# Patient Record
Sex: Female | Born: 1970 | Hispanic: Yes | Marital: Single | Smoking: Never smoker
Health system: Southern US, Community
[De-identification: ages and names within clinical notes are randomized; demographics above are authoritative.]

## PROBLEM LIST (undated history)

## (undated) DIAGNOSIS — E785 Hyperlipidemia, unspecified: Secondary | ICD-10-CM

## (undated) HISTORY — PX: BREAST BIOPSY: SHX20

---

## 2017-06-23 ENCOUNTER — Other Ambulatory Visit: Payer: Self-pay | Admitting: Specialist

## 2017-06-23 DIAGNOSIS — Z1231 Encounter for screening mammogram for malignant neoplasm of breast: Secondary | ICD-10-CM

## 2017-07-09 ENCOUNTER — Ambulatory Visit
Admission: RE | Admit: 2017-07-09 | Discharge: 2017-07-09 | Disposition: A | Discharge status: Home / Self Care | Payer: No Typology Code available for payment source | Source: Ambulatory Visit | Attending: Specialist | Admitting: Specialist

## 2017-07-09 DIAGNOSIS — Z1231 Encounter for screening mammogram for malignant neoplasm of breast: Secondary | ICD-10-CM

## 2017-07-13 ENCOUNTER — Other Ambulatory Visit: Payer: Self-pay | Admitting: Specialist

## 2017-07-13 DIAGNOSIS — R928 Other abnormal and inconclusive findings on diagnostic imaging of breast: Secondary | ICD-10-CM

## 2017-08-02 ENCOUNTER — Other Ambulatory Visit (HOSPITAL_COMMUNITY): Payer: Self-pay | Admitting: *Deleted

## 2017-08-02 DIAGNOSIS — R928 Other abnormal and inconclusive findings on diagnostic imaging of breast: Secondary | ICD-10-CM

## 2017-08-03 ENCOUNTER — Other Ambulatory Visit (HOSPITAL_COMMUNITY): Payer: Self-pay | Admitting: Obstetrics and Gynecology

## 2017-08-03 ENCOUNTER — Ambulatory Visit
Admission: RE | Admit: 2017-08-03 | Discharge: 2017-08-03 | Disposition: A | Discharge status: Home / Self Care | Payer: No Typology Code available for payment source | Source: Ambulatory Visit | Attending: Obstetrics and Gynecology | Admitting: Obstetrics and Gynecology

## 2017-08-03 ENCOUNTER — Ambulatory Visit (HOSPITAL_COMMUNITY)
Admission: RE | Admit: 2017-08-03 | Discharge: 2017-08-03 | Disposition: A | Discharge status: Home / Self Care | Payer: Self-pay | Source: Ambulatory Visit | Attending: Obstetrics and Gynecology | Admitting: Obstetrics and Gynecology

## 2017-08-03 ENCOUNTER — Encounter (HOSPITAL_COMMUNITY): Payer: Self-pay

## 2017-08-03 ENCOUNTER — Other Ambulatory Visit (HOSPITAL_COMMUNITY): Payer: Self-pay | Admitting: *Deleted

## 2017-08-03 VITALS — BP 96/64 | Temp 98.1°F | Ht 63.0 in | Wt 165.4 lb

## 2017-08-03 DIAGNOSIS — R928 Other abnormal and inconclusive findings on diagnostic imaging of breast: Secondary | ICD-10-CM

## 2017-08-03 DIAGNOSIS — N6310 Unspecified lump in the right breast, unspecified quadrant: Secondary | ICD-10-CM

## 2017-08-03 DIAGNOSIS — Z1239 Encounter for other screening for malignant neoplasm of breast: Secondary | ICD-10-CM

## 2017-08-03 NOTE — Progress Notes (Signed)
Patient referred to [facility] by the [facility] due to recommending additional imaging of the right breast. Screening mammogram completed 07/09/2017.  Pap Smear: Pap smear not completed today. Last Pap smear was in 2017 at [facility] in [facility] and normal per patient. Per patient has no history of an abnormal Pap smear. No Pap smear results are in EPIC.  Physical exam: Breasts Breasts symmetrical. No skin abnormalities bilateral breasts. No nipple retraction bilateral breasts. No nipple discharge bilateral breasts. No lymphadenopathy. No lumps palpated bilateral breasts. No complaints of pain or tenderness on exam. Referred patient to the [facility] of [facility] for a right breast diagnostic mammogram and possible breast ultrasound per recommendation. Appointment scheduled for Tuesday, August 03, 2017 at 1450.        Pelvic/Bimanual No Pap smear completed today since last Pap smear was in [address] per patient. Pap smear not indicated per BCCCP guidelines.   Smoking History: Patient has never smoked.  Patient Navigation: Patient education provided. Access to services provided for patient through [facility] program. Spanish interpreter provided.  Used Spanish interpreter [employer] from [place].

## 2017-08-03 NOTE — Patient Instructions (Signed)
Explained breast self awareness with Yasmin Henrie. Patient did not need a Pap smear today due to last Pap smear was in 2017 per patient. Let her know BCCCP will cover Pap smears every 3 years unless has a history of abnormal Pap smears. Referred patient to the [facility] of [facility] for a right breast diagnostic mammogram and possible breast ultrasound per recommendation. Appointment scheduled for Tuesday, August 03, 2017 at 1450. Symphani Kalt verbalized understanding.  Jourden Delmont, Kathaleen Maser, RN 3:27 PM

## 2017-08-04 ENCOUNTER — Encounter (HOSPITAL_COMMUNITY): Payer: Self-pay | Admitting: *Deleted

## 2017-08-05 ENCOUNTER — Other Ambulatory Visit (HOSPITAL_COMMUNITY): Payer: Self-pay | Admitting: Obstetrics and Gynecology

## 2017-08-05 ENCOUNTER — Ambulatory Visit
Admission: RE | Admit: 2017-08-05 | Discharge: 2017-08-05 | Disposition: A | Discharge status: Home / Self Care | Payer: No Typology Code available for payment source | Source: Ambulatory Visit | Attending: Obstetrics and Gynecology | Admitting: Obstetrics and Gynecology

## 2017-08-05 DIAGNOSIS — N6310 Unspecified lump in the right breast, unspecified quadrant: Secondary | ICD-10-CM

## 2017-08-05 HISTORY — PX: BREAST BIOPSY: SHX20

## 2019-04-17 ENCOUNTER — Other Ambulatory Visit: Payer: Self-pay | Admitting: *Deleted

## 2019-04-17 DIAGNOSIS — Z1231 Encounter for screening mammogram for malignant neoplasm of breast: Secondary | ICD-10-CM

## 2019-06-20 ENCOUNTER — Ambulatory Visit (HOSPITAL_COMMUNITY)
Admission: RE | Admit: 2019-06-20 | Discharge: 2019-06-20 | Disposition: A | Discharge status: Home / Self Care | Payer: Self-pay | Source: Ambulatory Visit | Attending: Obstetrics and Gynecology | Admitting: Obstetrics and Gynecology

## 2019-06-20 ENCOUNTER — Ambulatory Visit
Admission: RE | Admit: 2019-06-20 | Discharge: 2019-06-20 | Disposition: A | Discharge status: Home / Self Care | Payer: No Typology Code available for payment source | Source: Ambulatory Visit | Attending: Obstetrics and Gynecology | Admitting: Obstetrics and Gynecology

## 2019-06-20 ENCOUNTER — Encounter (HOSPITAL_COMMUNITY): Payer: Self-pay

## 2019-06-20 ENCOUNTER — Other Ambulatory Visit: Payer: Self-pay

## 2019-06-20 DIAGNOSIS — N898 Other specified noninflammatory disorders of vagina: Secondary | ICD-10-CM

## 2019-06-20 DIAGNOSIS — Z01419 Encounter for gynecological examination (general) (routine) without abnormal findings: Secondary | ICD-10-CM

## 2019-06-20 DIAGNOSIS — Z1231 Encounter for screening mammogram for malignant neoplasm of breast: Secondary | ICD-10-CM

## 2019-06-20 HISTORY — DX: Hyperlipidemia, unspecified: E78.5

## 2019-06-20 NOTE — Progress Notes (Signed)
No complaints today.   Pap Smear: Pap smear completed today. Last Pap smear was in 2017 at [facility] and normal per patient. Per patient has no history of an abnormal Pap smear. No Pap smear results are in Epic.  Physical exam: Breasts Breasts symmetrical. No skin abnormalities bilateral breasts. No nipple retraction bilateral breasts. No nipple discharge bilateral breasts. No lymphadenopathy. No lumps palpated bilateral breasts. No complaints of pain or tenderness on exam. Referred patient to the [place] for a screening mammogram. Appointment scheduled for Tuesday, June 20, 2019 at 1510.        Pelvic/Bimanual   Ext Genitalia No lesions, no swelling and no discharge observed on external genitalia.         Vagina Vagina pink and normal texture. No lesions and thick white discharge observed in vagina. Wet prep completed.       Cervix Cervix is present. Cervix pink and of normal texture. Thick white discharge observed on cervix.    Uterus Uterus is present and palpable. Uterus in normal position and normal size.        Adnexae Bilateral ovaries present and palpable. No tenderness on palpation.         Rectovaginal No rectal exam completed today since patient had no rectal complaints. No skin abnormalities observed on exam.    Smoking History: Patient has never smoked.  Patient Navigation: Patient education provided. Access to services provided for patient through [facility] program. Spanish interpreter provided.   Breast and Cervical Cancer Risk Assessment: Patient has no family history of breast cancer, known genetic mutations, or radiation treatment to the chest before age 6. Patient has no history of cervical dysplasia, immunocompromised, or DES exposure in-utero.  Risk Assessment    Risk Scores      06/20/2019   Last edited by: Armond Hang, LPN   5-year risk: 0.7 %   Lifetime risk: 6.5 %         Used Spanish interpreter Rudene Anda  from [place].

## 2019-06-20 NOTE — Patient Instructions (Signed)
Explained breast self awareness with Kristine Cohen. Let patient know BCCCP will cover Pap smears and HPV typing every 5 years unless has a history of abnormal Pap smears. Referred patient to the [facility] for a screening mammogram. Appointment scheduled for Tuesday, June 20, 2019 at 1510. Patient aware of appointment and will be there. Let patient know will follow up with her within the next couple weeks with results with results of Pap smear and wet prep by phone. Informed patient that the Breast Center will follow-up with her within the next couple of weeks with results of mammogram by letter or phone. Kristine Cohen verbalized understanding.  Kristine Cohen, Arvil Chaco, RN 2:17 PM

## 2019-06-21 LAB — CERVICOVAGINAL ANCILLARY ONLY
Bacterial vaginitis: NEGATIVE
Candida vaginitis: NEGATIVE
Trichomonas: NEGATIVE

## 2019-06-22 LAB — CYTOLOGY - PAP
Diagnosis: NEGATIVE
HPV: NOT DETECTED

## 2019-06-30 ENCOUNTER — Telehealth (HOSPITAL_COMMUNITY): Payer: Self-pay | Admitting: *Deleted

## 2019-06-30 NOTE — Telephone Encounter (Signed)
Called patient with Spanish interpreter Benjamine Sprague. Let her know that her Pap smear was normal and HPV negative. Informed patient that her next Pap smear is due in 5 years. Patient verbalized understanding.

## 2019-07-03 ENCOUNTER — Encounter: Payer: Self-pay | Admitting: *Deleted

## 2022-01-19 ENCOUNTER — Other Ambulatory Visit: Payer: Self-pay

## 2022-01-19 DIAGNOSIS — Z1231 Encounter for screening mammogram for malignant neoplasm of breast: Secondary | ICD-10-CM

## 2022-02-03 ENCOUNTER — Ambulatory Visit
Admission: RE | Admit: 2022-02-03 | Discharge: 2022-02-03 | Disposition: A | Discharge status: Home / Self Care | Payer: Self-pay | Source: Ambulatory Visit | Attending: Obstetrics and Gynecology | Admitting: Obstetrics and Gynecology

## 2022-02-03 ENCOUNTER — Ambulatory Visit: Payer: No Typology Code available for payment source | Admitting: *Deleted

## 2022-02-03 ENCOUNTER — Other Ambulatory Visit: Payer: Self-pay

## 2022-02-03 VITALS — BP 120/80 | Wt 176.7 lb

## 2022-02-03 DIAGNOSIS — Z1231 Encounter for screening mammogram for malignant neoplasm of breast: Secondary | ICD-10-CM

## 2022-02-03 DIAGNOSIS — Z1239 Encounter for other screening for malignant neoplasm of breast: Secondary | ICD-10-CM

## 2022-02-03 DIAGNOSIS — Z1211 Encounter for screening for malignant neoplasm of colon: Secondary | ICD-10-CM

## 2022-02-03 NOTE — Patient Instructions (Signed)
Explained breast self awareness with Kristine Cohen. Patient did not need a Pap smear today due to last Pap smear and HPV typing was 06/20/2019. Let her know BCCCP will cover Pap smears and HPV typing every 5 years unless has a history of abnormal Pap smears. Referred patient to the [facility] for a screening mammogram on mobile unit. Appointment scheduled Tuesday, February 03, 2022 at 1310. Patient aware of appointment and will be there. Let patient know the Breast Center will follow up with her within the next couple weeks with results of her mammogram by letter or phone. Kristine Cohen verbalized understanding. ? ?Kristine Cohen, Kristine Maser, RN ?11:20 AM ? ? ? ? ?

## 2022-02-03 NOTE — Progress Notes (Signed)
Kristine Cohen is a 51 y.o. female who presents to [facility] clinic today with no complaints.  ?  ?Pap Smear: Pap smear not completed today. Last Pap smear was 06/20/2019 at [facility] clinic and was normal with negative HPV. Per patient has no history of an abnormal Pap smear. Last Pap smear result is available in Epic. ?  ?Physical exam: ?Breasts ?Breasts symmetrical. No skin abnormalities bilateral breasts. No nipple retraction bilateral breasts. No nipple discharge bilateral breasts. No lymphadenopathy. No lumps palpated bilateral breasts. No complaints of pain or tenderness on exam. ? ?MS DIGITAL SCREENING BILATERAL ? ?Result Date: 07/12/2017 ?CLINICAL DATA:  Screening. EXAM: DIGITAL SCREENING BILATERAL MAMMOGRAM WITH CAD COMPARISON:  None ACR Breast Density Category c: The breast tissue is heterogeneously dense, which may obscure small masses. FINDINGS: In the right breast, a possible asymmetry warrants further evaluation. In the left breast, no findings suspicious for malignancy. Images were processed with CAD. IMPRESSION: Further evaluation is suggested for possible asymmetry in the right breast. RECOMMENDATION: Diagnostic mammogram and possibly ultrasound of the right breast. (Code:[id number]-29M) The patient will be contacted regarding the findings, and additional imaging will be scheduled. BI-RADS CATEGORY  0: Incomplete. Need additional imaging evaluation and/or prior mammograms for comparison. Electronically Signed   By: Norva Pavlov M.D.   On: 07/12/2017 08:44  ? ?MM DIAG BREAST TOMO UNI RIGHT ? ?Result Date: 08/03/2017 ?CLINICAL DATA:  The patient was called back for a mass in the lateral superior right breast. EXAM: 2D DIGITAL DIAGNOSTIC RIGHT MAMMOGRAM WITH CAD AND ADJUNCT TOMO ULTRASOUND RIGHT BREAST COMPARISON:  None ACR Breast Density Category b: There are scattered areas of fibroglandular density. FINDINGS: There is a superficial mass in the lateral superior right breast which is oval with gentle  lobulations, measuring 5 mm. This persists on additional imaging today. No other suspicious masses identified. Mammographic images were processed with CAD. On physical exam, no suspicious lumps are identified. Targeted ultrasound is performed, showing a superficial mass at 9 o'clock, 5 cm from the nipple correlating with the mammographic finding measuring 5 x 2 mm. There is suggestion of a fatty hilum. This is likely an intramammary lymph node. Incidentally, there is an irregular mass at 8:30, 3 cm from the nipple measuring 8 x 5 x 6 mm with no posterior shadowing. No axillary adenopathy. IMPRESSION: The mass in the lateral right breast which resulted in a screening callback is likely an intramammary lymph node. There is an incidental mass at 8:30, 3 cm from the nipple which is irregular in shape and indeterminate. RECOMMENDATION: Recommend ultrasound-guided biopsy of the 8:30 right breast mass seen sonographically. If the biopsy is benign, recommend a six-month follow-up ultrasound of the lateral right mass at 9 o'clock which is likely an intramammary lymph node. I have discussed the findings and recommendations with the patient. Results were also provided in writing at the conclusion of the visit. If applicable, a reminder letter will be sent to the patient regarding the next appointment. BI-RADS CATEGORY  4: Suspicious. Electronically Signed   By: Gerome Sam III M.D   On: 08/03/2017 16:26  ? ?MS DIGITAL SCREENING TOMO BILATERAL ? ?Result Date: 06/21/2019 ?CLINICAL DATA:  Screening. EXAM: DIGITAL SCREENING BILATERAL MAMMOGRAM WITH TOMO AND CAD COMPARISON:  Previous exam(s). ACR Breast Density Category b: There are scattered areas of fibroglandular density. FINDINGS: There are no findings suspicious for malignancy. Images were processed with CAD. IMPRESSION: No mammographic evidence of malignancy. A result letter of this screening mammogram will be mailed directly  to the patient. RECOMMENDATION: Screening  mammogram in one year. (Code:[id number]-01Y) BI-RADS CATEGORY  1: Negative. Electronically Signed   By: Britta Mccreedy M.D.   On: 06/21/2019 12:29  ? ?MM CLIP PLACEMENT RIGHT ? ?Result Date: 08/05/2017 ?CLINICAL DATA:  51 year old female status post ultrasound-guided biopsy of the indeterminate right breast mass. EXAM: DIAGNOSTIC RIGHT MAMMOGRAM POST ULTRASOUND BIOPSY COMPARISON:  Previous exam(s). FINDINGS: Mammographic images were obtained following ultrasound guided biopsy of a right breast mass. A ribbon shaped clip is identified in the inferolateral right breast. This is in the expected locations status post ultrasound-guided biopsy. IMPRESSION: Ribbon shaped clip in the expected location status post ultrasound-guided biopsy of a right breast mass. Final Assessment: Post Procedure Mammograms for Marker Placement Electronically Signed   By: Sande Brothers M.D.   On: 08/05/2017 14:51   ?      ?Pelvic/Bimanual ?Pap is not indicated today per BCCCP guidelines. ?  ?Smoking History: ?Patient has never smoked. ?  ?Patient Navigation: ?Patient education provided. Access to services provided for patient through [facility] program. Spanish interpreter Natale Lay from [facility] provided.  ? ?Colorectal Cancer Screening: ?Per patient had a colonoscopy completed February 2022 in [place]. FIT Test given to patient to complete. No complaints today.  ?  ?Breast and Cervical Cancer Risk Assessment: ?Patient does not have family history of breast cancer, known genetic mutations, or radiation treatment to the chest before age 10. Patient does not have history of cervical dysplasia, immunocompromised, or DES exposure in-utero. ? ?Risk Assessment   ? ? Risk Scores   ? ?   02/03/2022 06/20/2019  ? Last edited by: Meryl Dare, CMA Lynnell Dike, LPN  ? 5-year risk: 0.7 % 0.7 %  ? Lifetime risk: 6.2 % 6.5 %  ? ?  ?  ? ?  ? ? ?A: ?BCCCP exam without pap smear ?No complaints. ? ?P: ?Referred patient to the [facility] of [facility]  for a screening mammogram on mobile unit. Appointment scheduled Tuesday, February 03, 2022 at 1310. ? ?Priscille Heidelberg, RN ?02/03/2022 11:20 AM   ?

## 2022-02-18 ENCOUNTER — Other Ambulatory Visit: Payer: Self-pay

## 2022-02-18 ENCOUNTER — Inpatient Hospital Stay: Payer: Self-pay | Attending: Obstetrics and Gynecology | Admitting: *Deleted

## 2022-02-18 VITALS — BP 110/78 | Ht 63.0 in | Wt 178.9 lb

## 2022-02-18 DIAGNOSIS — Z0000 Encounter for general adult medical examination without abnormal findings: Secondary | ICD-10-CM

## 2022-02-18 NOTE — Progress Notes (Signed)
[facility] initial screening ?  ?Interpreter- Natale Lay, [employer] ?  ?Clinical Measurement:  ?Vitals:  ? 02/18/22 1028  ?BP: 118/76  ? Fasting Labs Drawn Today, will review with patient when they result. ?  ?Medical History:  Patient states that she  does not know if she has  high cholesterol, does not have high blood pressure and she does not have diabetes. ? ?Medications:  Patient states that she does not take medication to lower cholesterol, blood pressure or blood sugar.  Patient does not take an aspirin a day to help prevent a heart attack or stroke.  ?  ?Blood pressure, self measurement: Patient states that she does not measure blood pressure from home. She checks her blood pressure N/A. She shares her readings with a health care provider: N/A. ?  ?Nutrition: Patient states that on average she eats 0 cups of fruit and 1 cups of vegetables per day. Patient states that she does not eat fish at least 2 times per week. Patient eats less than half servings of whole grains. Patient drinks less than 36 ounces of beverages with added sugar weekly: yes. Patient is currently watching sodium or salt intake: yes. In the past 7 days patient has consumed drinks containing alcohol on 0 days. On a day that patient consumes drinks containing alcohol on average 0 drinks are consumed.     ? ?Physical activity:  Patient states that she gets 120 minutes of moderate and 0 minutes of vigorous physical activity each week. ? ?Smoking status:  Patient states that she has has never smoked . ?  ?Quality of life:  Over the past 2 weeks patient states that she had little interest or pleasure in doing things: not at all. She has been feeling down, depressed or hopeless:not at all.  ?  ?Risk reduction and counseling:  ? ?Health Coaching: Spoke in detail with patient about diet. Explained to patient that the daily recommendation is for 2 cups of fruit and 3 cups of vegetables per day. Showed patient what a serving size would look like.  Patient consumes fish once a week. Spoke with patient about heart healthy fish that she can try adding into diet. Patient consumes whole wheat bread and oatmeal. Spoke with patient about the benefits of whole grain. Gave suggestions for other whole grains that patient can try. Patient has been walking 3 days a week. Spoke with patient about the importance of daily exercise. ?  ?Goal: Patient would like to exercise more. Patient would like to start exercising for 45 minutes 3-4 days a week over the next 3 months. Patient would also like to watch and cut back on portion/serving sizes. Patient will use measuring cups to help measure out servings over the next 3 months.  ? ?Navigation:  I will notify patient of lab results.  Patient is aware of 2 more health coaching sessions and a follow up. ? ?Time: 45 minutes  ?

## 2022-02-19 LAB — LIPID PANEL
Chol/HDL Ratio: 2.4 ratio (ref 0.0–4.4)
Cholesterol, Total: 215 mg/dL — ABNORMAL HIGH (ref 100–199)
HDL: 90 mg/dL (ref >39)
LDL Chol Calc (NIH): 115 mg/dL — ABNORMAL HIGH (ref 0–99)
Triglycerides: 54 mg/dL (ref 0–149)
VLDL Cholesterol Cal: 10 mg/dL (ref 5–40)

## 2022-02-19 LAB — HEMOGLOBIN A1C
Est. average glucose Bld gHb Est-mCnc: 123 mg/dL
Hgb A1c MFr Bld: 5.9 % — ABNORMAL HIGH (ref 4.8–5.6)

## 2022-02-19 LAB — GLUCOSE, RANDOM: Glucose: 86 mg/dL (ref 70–99)

## 2022-02-23 ENCOUNTER — Telehealth: Payer: Self-pay

## 2022-02-23 NOTE — Telephone Encounter (Signed)
Health coaching 2 ?  ?interpreter- Natale Lay, [employer] ?  ?Labs- 215 cholesterol, 115 LDL cholesterol, 54 triglycerides, 90 HDL cholesterol, 5.9 hemoglobin A1C, 86 mean plasma glucose. Patient understands and is aware of her lab results. ?  ?Goals-  1. Reduce the amount of fried and fatty foods consumed. Try to grill, bake, broil or sautee foods instead.  ?2. Reduce the amount of red meats consumed. Substitute for lean proteins like chicken or fish.  ?3. Reduce the amount of whole fat dairy products consumed. Look for low-fat or reduced-fat options instead. ?4. Reduce the amount of sweets consumed in both foods and drinks.  ?4. Reduce the amount of carbs consumed. ?5. Start exercising for 20-30 minutes daily. ?  ?Navigation:  Patient is aware of 1 more health coaching sessions and a follow up. Patient is scheduled for follow-up with Internal Medicine on Mar 02, 2022 @ 3:15 pm. Will refer patient to [facility] [place] [facility]. ? ?Time-  25 minutes ?

## 2022-03-02 ENCOUNTER — Encounter: Payer: No Typology Code available for payment source | Admitting: Internal Medicine

## 2022-03-09 ENCOUNTER — Encounter: Payer: No Typology Code available for payment source | Admitting: Internal Medicine

## 2022-07-08 ENCOUNTER — Telehealth: Payer: Self-pay

## 2022-07-08 NOTE — Telephone Encounter (Signed)
Left message for patient about completing HC 3 for the [facility] program. Left name and number for patient to call back. 

## 2022-07-13 IMAGING — MG MM DIGITAL SCREENING BILAT W/ TOMO AND CAD
8 series · 8 of 24 positions shown · non-contrast
Comparison: Previous exam(s).

CLINICAL DATA: Screening.

EXAM:
DIGITAL SCREENING BILATERAL MAMMOGRAM WITH TOMOSYNTHESIS AND CAD
TECHNIQUE: Bilateral screening digital craniocaudal and mediolateral oblique
mammograms were obtained. Bilateral screening digital breast
tomosynthesis was performed. The images were evaluated with
computer-aided detection.

[R CC synth-2D]
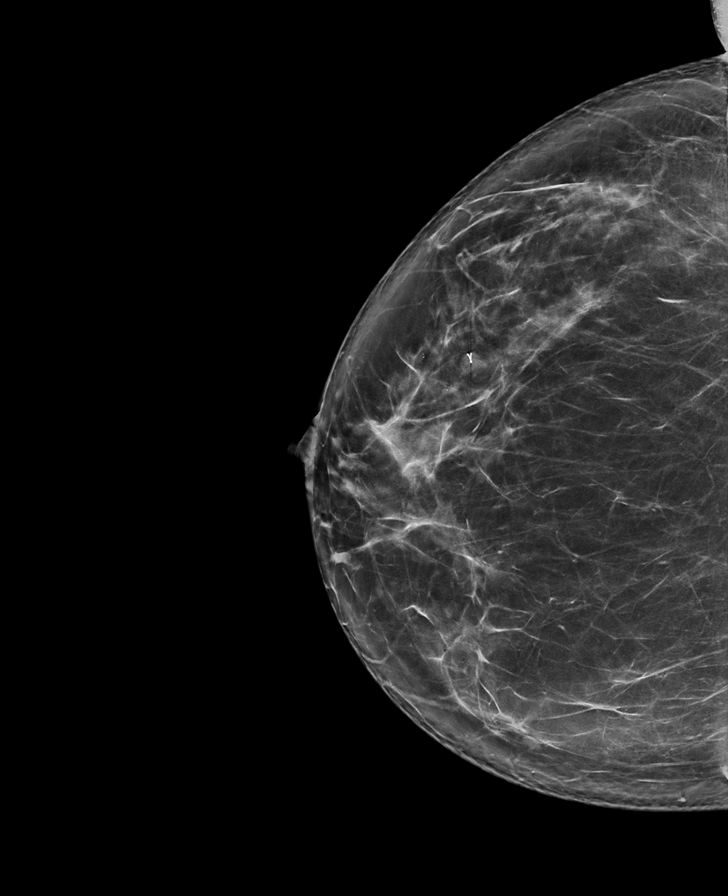

[L CC synth-2D]
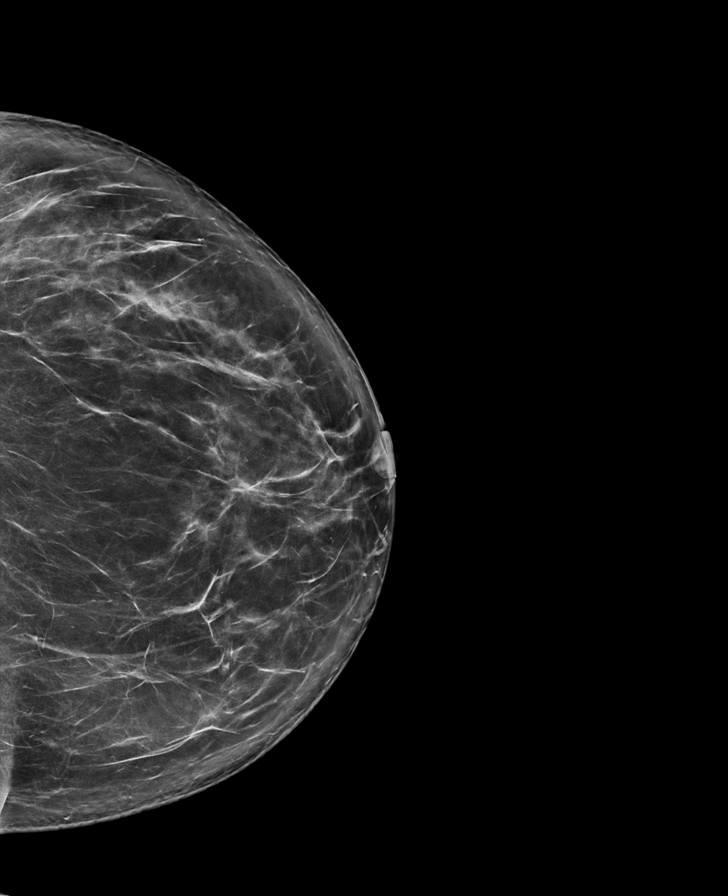

[L MLO synth-2D]
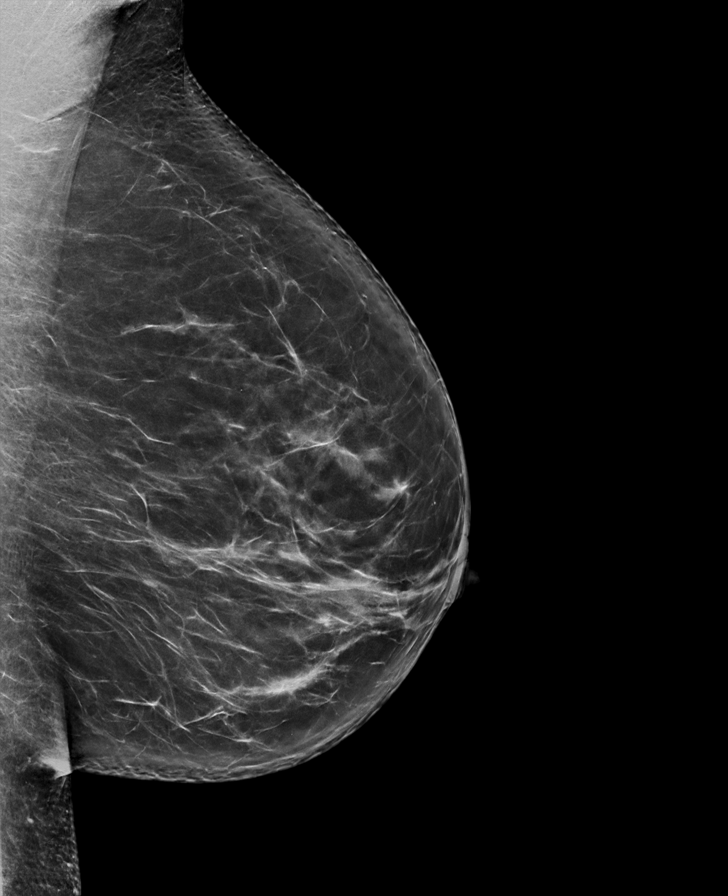

[R MLO synth-2D]
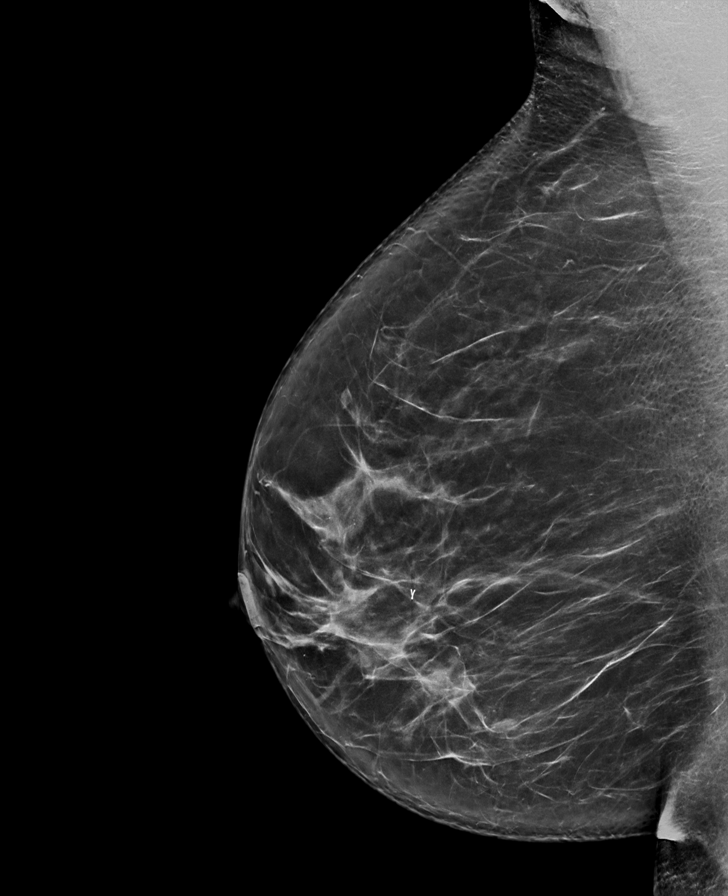

[R MLO tomo · tomo slice 47/92.0]
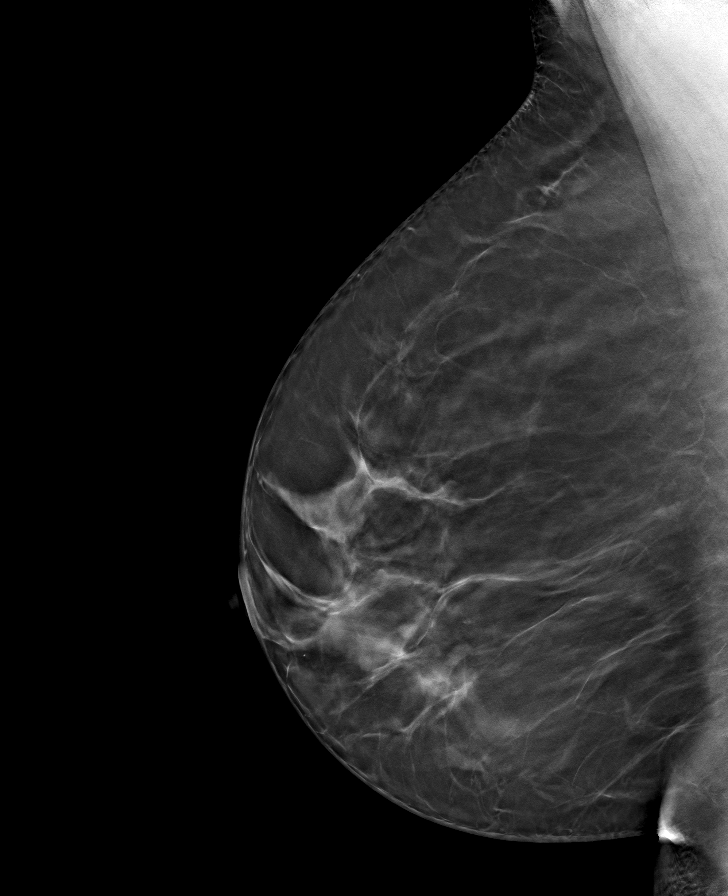

[L MLO tomo · tomo slice 45/90.0]
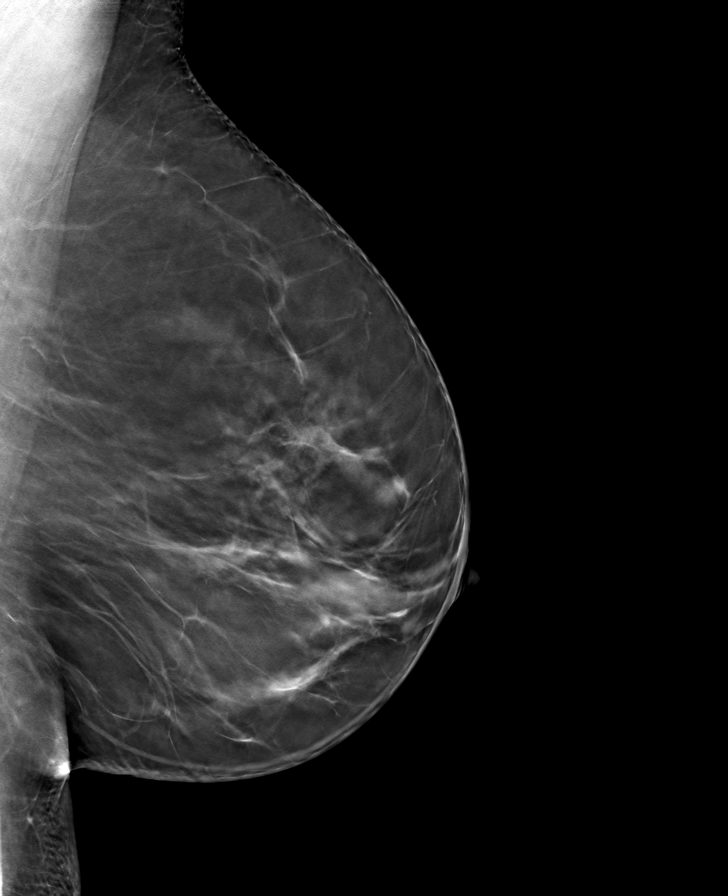

[R CC tomo · tomo slice 45/88.0]
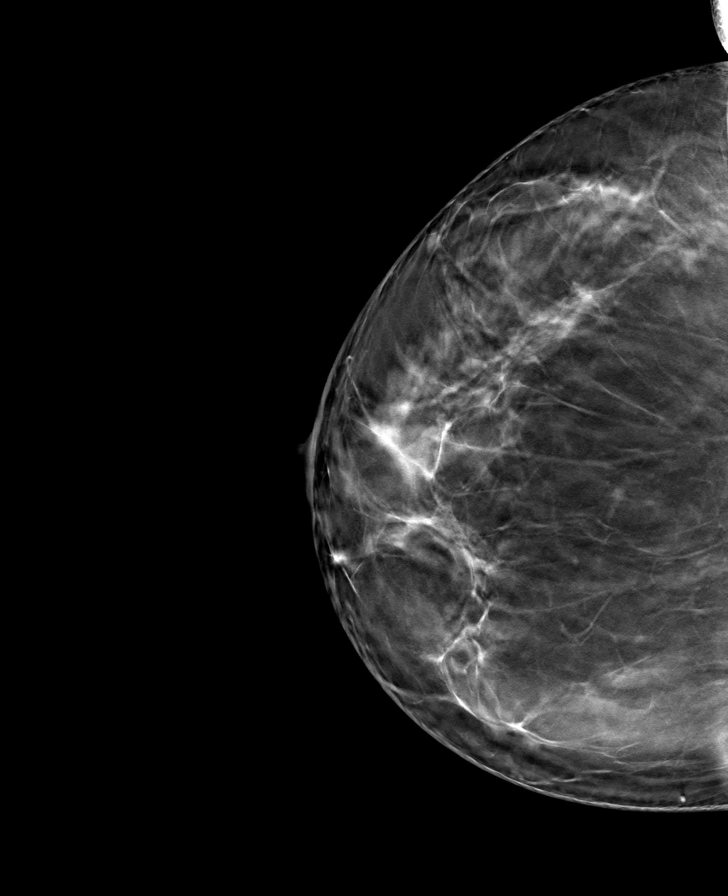

[L CC tomo · tomo slice 43/86.0]
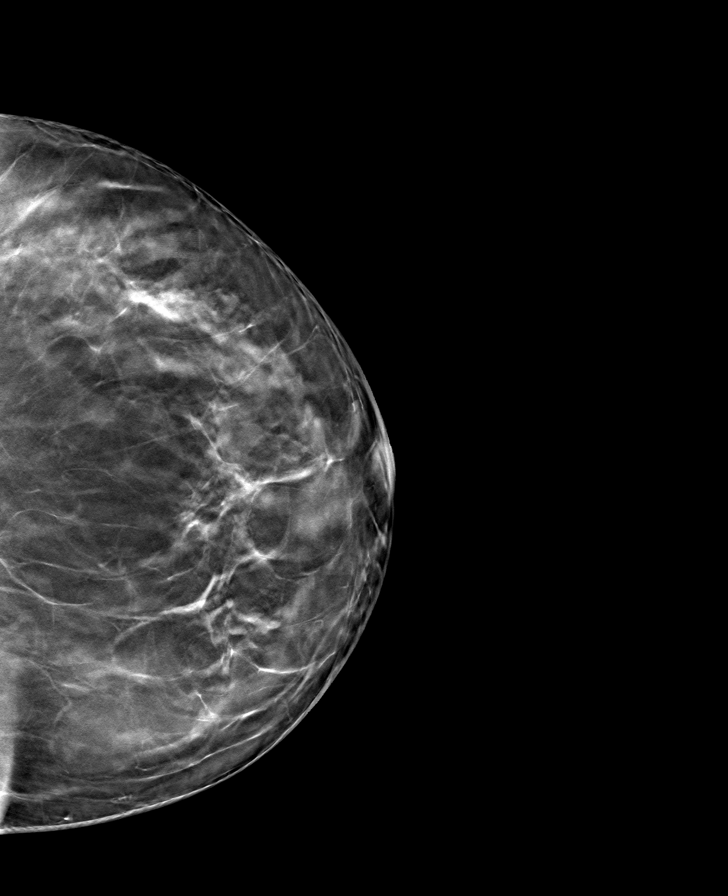

[8 of 24 positions shown; findings below may reference images not displayed]

ACR Breast Density Category b: There are scattered areas of
fibroglandular density.
FINDINGS: There are no findings suspicious for malignancy.
IMPRESSION: No mammographic evidence of malignancy. A result letter of this
screening mammogram will be mailed directly to the patient.

RECOMMENDATION:
Screening mammogram in one year. (Code:51-O-LD2)

BI-RADS CATEGORY  1: Negative.

## 2024-06-30 ENCOUNTER — Other Ambulatory Visit: Payer: Self-pay | Admitting: Obstetrics and Gynecology

## 2024-06-30 DIAGNOSIS — Z1231 Encounter for screening mammogram for malignant neoplasm of breast: Secondary | ICD-10-CM

## 2024-09-07 ENCOUNTER — Ambulatory Visit

## 2024-09-07 ENCOUNTER — Encounter

## 2024-11-06 NOTE — Progress Notes (Unsigned)
 Ms. Kristine Cohen is a 54 y.o. G3P0 female who presents to [facility] clinic today with {Blank single:19197::no complaints,complaint of} ***.    Pap Smear: Pap smear completed today. Last Pap smear was 06/20/2019 at [facility] clinic and was normal with negative HPV. Per patient has no history of an abnormal Pap smear. Last Pap smear result is available in Epic.   Physical exam: Breasts Breasts symmetrical. No skin abnormalities bilateral breasts. No nipple retraction bilateral breasts. No nipple discharge bilateral breasts. No lymphadenopathy. No lumps palpated bilateral breasts. No complaints of pain or tenderness on exam.   MS DIGITAL SCREENING TOMO BILATERAL Result Date: 02/04/2022 CLINICAL DATA:  Screening. EXAM: DIGITAL SCREENING BILATERAL MAMMOGRAM WITH TOMOSYNTHESIS AND CAD TECHNIQUE: Bilateral screening digital craniocaudal and mediolateral oblique mammograms were obtained. Bilateral screening digital breast tomosynthesis was performed. The images were evaluated with computer-aided detection. COMPARISON:  Previous exam(s). ACR Breast Density Category b: There are scattered areas of fibroglandular density. FINDINGS: There are no findings suspicious for malignancy. IMPRESSION: No mammographic evidence of malignancy. A result letter of this screening mammogram will be mailed directly to the patient. RECOMMENDATION: Screening mammogram in one year. (Code:SM-B-01Y) BI-RADS CATEGORY  1: Negative. Electronically Signed   By: Dobrinka  Dimitrova M.D.   On: 02/04/2022 08:17    Pelvic/Bimanual Ext Genitalia No lesions, no swelling and no discharge observed on external genitalia.        Vagina Vagina pink and normal texture. No lesions or discharge observed in vagina.        Cervix Cervix is present. Cervix pink and of normal texture. No discharge observed.    Uterus Uterus is present and palpable. Uterus in normal position and normal size.        Adnexae Bilateral ovaries present and palpable. No  tenderness on palpation.         Rectovaginal No rectal exam completed today since patient had no rectal complaints. No skin abnormalities observed on exam.     Smoking History: Patient has never smoked.   Patient Navigation: Patient education provided. Access to services provided for patient through [facility] program. Spanish interpreter Bernice Angry from [facility] provided.    Colorectal Cancer Screening: Per patient had a colonoscopy completed February 2022 in [place]. FIT Test given to patient to complete. No complaints today.    Breast and Cervical Cancer Risk Assessment: Patient does not have family history of breast cancer, known genetic mutations, or radiation treatment to the chest before age 69. Patient does not have history of cervical dysplasia, immunocompromised, or DES exposure in-utero.  Risk Assessment   No risk assessment data for the current encounter  Risk Scores       02/03/2022   Last edited by: Logan Lyle BRAVO, CMA   5-year risk: 0.7%   Lifetime risk: 6.2%            A: BCCCP exam with pap smear Complaint of ***  P: Referred patient to the [facility] of [facility] for a screening mammogram on mobile unit. Appointment scheduled Tuesday, November 07, 2024 at 1100.  Driscilla Wanda SQUIBB, RN 11/06/2024 12:18 PM

## 2024-11-07 ENCOUNTER — Ambulatory Visit: Admission: RE | Admit: 2024-11-07 | Source: Ambulatory Visit

## 2024-11-07 ENCOUNTER — Ambulatory Visit: Payer: Self-pay | Admitting: *Deleted

## 2024-11-07 VITALS — BP 127/78 | Ht 63.0 in | Wt 181.0 lb

## 2024-11-07 DIAGNOSIS — Z01419 Encounter for gynecological examination (general) (routine) without abnormal findings: Secondary | ICD-10-CM

## 2024-11-07 DIAGNOSIS — R2231 Localized swelling, mass and lump, right upper limb: Secondary | ICD-10-CM

## 2024-11-07 DIAGNOSIS — Z1211 Encounter for screening for malignant neoplasm of colon: Secondary | ICD-10-CM

## 2024-11-07 DIAGNOSIS — Z1239 Encounter for other screening for malignant neoplasm of breast: Secondary | ICD-10-CM

## 2024-11-07 NOTE — Patient Instructions (Addendum)
 Explained breast self awareness with Kristine Cohen. Pap smear completed today. Let her know BCCCP will cover Pap smears and HPV typing every 5 years unless has a history of abnormal Pap smears. Referred patient to the [facility] for a diagnostic mammogram. Appointment scheduled Thursday, November 23, 2024 at 0940. Patient aware of appointment and will be there. Let patient know will follow up with her within the next couple weeks with results of her Pap smear by letter or phone. [name] verbalized understanding.  Loretto Belinsky, Wanda Ship, RN 11:12 AM

## 2024-11-07 NOTE — Progress Notes (Signed)
 Using [employer], Geni Angry, pt has been provided a Taxi home from her visit with BCCCP today. Pt has been advised, again, to request transportation at the time of scheduling her appointment. She expressed understanding of this information and waiver has been signed.

## 2024-11-08 ENCOUNTER — Ambulatory Visit: Payer: Self-pay

## 2024-11-08 LAB — CYTOLOGY - PAP
Comment: NEGATIVE
Diagnosis: NEGATIVE
High risk HPV: NEGATIVE

## 2024-11-23 ENCOUNTER — Ambulatory Visit
Admission: RE | Admit: 2024-11-23 | Discharge: 2024-11-23 | Disposition: A | Source: Ambulatory Visit | Attending: Obstetrics and Gynecology | Admitting: Obstetrics and Gynecology

## 2024-11-23 DIAGNOSIS — R2231 Localized swelling, mass and lump, right upper limb: Secondary | ICD-10-CM

## 2024-12-20 ENCOUNTER — Other Ambulatory Visit: Payer: Self-pay

## 2024-12-20 ENCOUNTER — Inpatient Hospital Stay
